# Patient Record
Sex: Female | Born: 2000 | Race: Black or African American | Hispanic: No | Marital: Single | Smoking: Never smoker
Health system: Southern US, Community
[De-identification: ages and names within clinical notes are randomized; demographics above are authoritative.]

## PROBLEM LIST (undated history)

## (undated) DIAGNOSIS — F32A Depression, unspecified: Secondary | ICD-10-CM

## (undated) DIAGNOSIS — D649 Anemia, unspecified: Secondary | ICD-10-CM

## (undated) DIAGNOSIS — J45909 Unspecified asthma, uncomplicated: Secondary | ICD-10-CM

## (undated) DIAGNOSIS — F419 Anxiety disorder, unspecified: Secondary | ICD-10-CM

## (undated) HISTORY — DX: Unspecified asthma, uncomplicated: J45.909

## (undated) HISTORY — DX: Depression, unspecified: F32.A

## (undated) HISTORY — DX: Anemia, unspecified: D64.9

## (undated) HISTORY — DX: Anxiety disorder, unspecified: F41.9

---
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
## 2022-01-01 ENCOUNTER — Ambulatory Visit: Payer: Medicaid Other

## 2022-01-01 NOTE — Therapy (Incomplete)
OUTPATIENT PHYSICAL THERAPY TREATMENT NOTE   Patient Name: Kaitlyn Malone MRN: [id number] DOB:03/30/2001, 21 y.o., female Today's Date: 01/01/2022  PCP: Lucious Groves, Johnney Killian, NP REFERRING PROVIDER: Lucious Groves, Johnney Killian, NP  END OF SESSION:     Past Medical History:  Diagnosis Date   Anemia    Phreesia 05/04/2020   Anxiety    Phreesia 05/04/2020   Asthma    [employer]reesia 05/04/2020   Depression    [employer]reesia 05/04/2020   No past surgical history on file. Patient Active Problem List   Diagnosis Date Noted   Skin rash 03/01/2021   Breast lump or mass 10/04/2019   Family history of breast cancer 10/04/2019   Bilateral ganglion cysts of wrists 08/13/2018   Mild intermittent asthma without complication 60/15/6153    REFERRING DIAG: M54.2 (ICD-10-CM) - Neck pain  THERAPY DIAG:  No diagnosis found.  PERTINENT HISTORY:  None  PRECAUTIONS:  None  SUBJECTIVE:  ***  PAIN:  Are you having pain?  No: NPRS scale: 0/10 (7/10 worst) Pain location: posterior neck, C7 Pain description: sharp Aggravating factors: washing dishes Relieving factors: rest   OBJECTIVE: (objective measures completed at initial evaluation unless otherwise dated)  DIAGNOSTIC FINDINGS:  N/A   PATIENT SURVEYS:  NDI 6% disability     COGNITION: Overall cognitive status: Within functional limits for tasks assessed     SENSATION: WFL   POSTURE:  Fwd head, rounded shoulders   PALPATION: Hypomobile segments C6-T1           CERVICAL ROM:    Active ROM A/PROM (deg) 12/04/2021  Right rotation 58  Left rotation 61   (Blank rows = not tested)   UE MMT:   MMT Right 12/04/2021 Left 12/04/2021  Middle trapezius 3+/5 3+/5  Lower trapezius 3+/5 3+/5            (Blank rows = not tested)   CERVICAL SPECIAL TESTS:  Neck flexor muscle endurance test: 15 sec     TODAY'S TREATMENT:  [facility] Adult PT Treatment:                                                DATE:  01/01/2022 Therapeutic Exercise: UBE lvl 1.0 x 4 min while taking subjective Row 2x12 17# Shoulder extension 2x10 17# Seated horizontal abd 3x10 RTB  Chin tuck x 10 - 5" Cat cow x 10 S/L open book x 10 Serratus punch 2x10 3#  Supine chin x10 each Chin tuck against ball 2x10 Seated row 2x10 30# Serratus wall slide 2x10 YTB   [facility] Adult PT Treatment:                                                DATE: 12/26/2021 Therapeutic Exercise: UBE lvl 1.0 x 4 min while taking subjective Row 2x12 17# Shoulder extension 2x10 17# Seated horizontal abd 3x10 RTB  Chin tuck x 10 - 5" Cat cow x 10 S/L open book x 10 Serratus punch 2x10 3#  Supine chin x10 each Chin tuck against ball 2x10 Seated row 2x10 30# Serratus wall slide 2x10 YTB   [facility] Adult PT Treatment:  DATE: 12/04/2021 Therapeutic Exercise: Chin tuck x 5 - 5" Cat cow x 5 Rotation SNAG x 5 each Ext SNAG x 5 Row Black TB x 10   PATIENT EDUCATION:  Education details: eval findings, NDI, HEP, POC Person educated: Patient and patient's mother Education method: Explanation, Demonstration, and Handouts Education comprehension: verbalized understanding and returned demonstration     HOME EXERCISE PROGRAM:  Access Code: [email or web address] URL: [email or web address] Date: 12/04/2021 Prepared by: Octavio Manns   Exercises - Supine Chin Tuck  - 1 x daily - 7 x weekly - 2 sets - 10 reps - 5 sec hold - Cat Cow  - 1 x daily - 7 x weekly - 2 sets - 10 reps - Seated Assisted Cervical Rotation with Towel  - 1 x daily - 7 x weekly - 2 sets - 10 reps - 5" sec hold - cervical extension snag with towel  - 1 x daily - 7 x weekly - 2 sets - 10 reps - 5" sec hold - Standing Shoulder Row with Anchored Resistance  - 1 x daily - 7 x weekly - 3 sets - 10 reps   ASSESSMENT:   CLINICAL IMPRESSION: ***     OBJECTIVE IMPAIRMENTS decreased ROM, decreased strength, and pain.    ACTIVITY  LIMITATIONS community activity.    PERSONAL FACTORS Time since onset of injury/illness/exacerbation are also affecting patient's functional outcome.      GOALS: Goals reviewed with patient? No   SHORT TERM GOALS: Target date: 12/25/2021   Pt will be compliant and knowledgeable with initial HEP for improved comfort and carryover Baseline: initial HEP given Goal status: MET   LONG TERM GOALS: Target date: 01/15/2022   Pt will self report neck pain no greater than 0/10 for improved comfort and functional ability Baseline: 7/10 at worst Goal status: INITIAL   2.  Pt will improve bilateral cervical rotation AROM to no less than 75 deg for improved comfort and function Baseline: see chart Goal status: INITIAL   3.  Pt will improve bilateral middle and lower trap strength to no less than 4/5 for improved postural endurance and function Baseline: 3+/5 Goal status: INITIAL   4.  Pt will lower NDI to no greater than 0% as proxy for functional improvement Baseline: 6% disability Goal status: INITIAL     PLAN: PT FREQUENCY: 1x/week   PT DURATION: 6 weeks   PLANNED INTERVENTIONS: Therapeutic exercises, Therapeutic activity, Neuromuscular re-education, Balance training, Gait training, Patient/Family education, Joint mobilization, Dry Needling, Electrical stimulation, and Manual therapy   PLAN FOR NEXT SESSION: assess HEP response, progress as able   Ward Chatters, PT 01/01/2022, 11:19 AM
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
